# Patient Record
Sex: Male | Born: 1945 | Race: White | Hispanic: No | Marital: Married
Health system: Southern US, Community
[De-identification: ages and names within clinical notes are randomized; demographics above are authoritative.]

---

## 2006-12-26 ENCOUNTER — Ambulatory Visit: Payer: Self-pay | Admitting: Cardiology

## 2012-10-02 ENCOUNTER — Emergency Department: Payer: Self-pay | Admitting: Emergency Medicine

## 2012-10-03 ENCOUNTER — Ambulatory Visit: Payer: Self-pay | Admitting: Internal Medicine

## 2013-10-05 IMAGING — US US EXTREM LOW VENOUS*L*
1 series · 14 of 23 positions shown · non-contrast
Comparison: none

REASON FOR EXAM: Call Report [id number]  eval for DVT lower leg Arthralgia
edema
COMMENTS:

[Series 1: us extrem low venous*left* · 0.09mm/px · 14 of 23 slices shown]
[im 1/23]
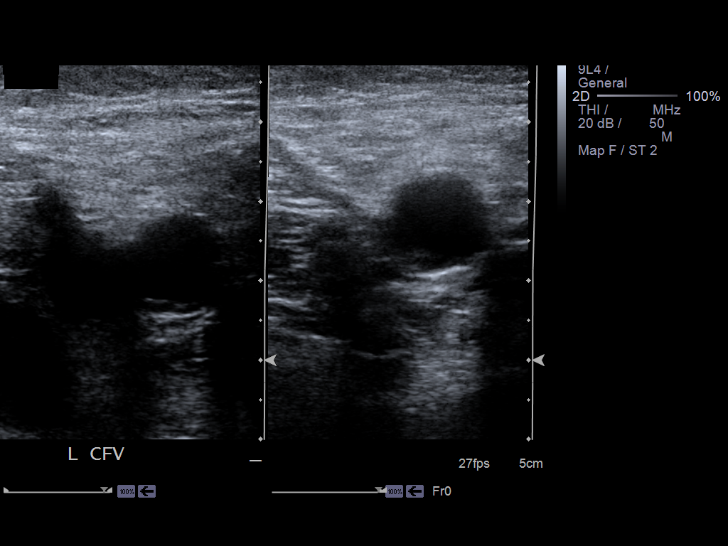
[im 3/23]
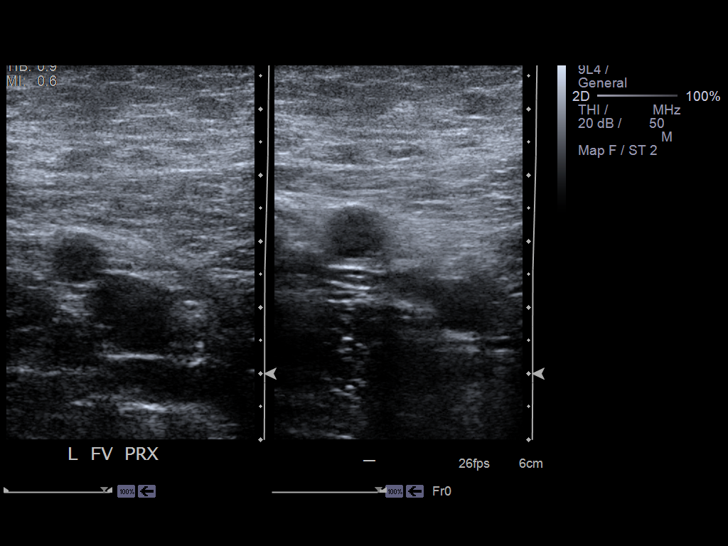
[im 5/23]
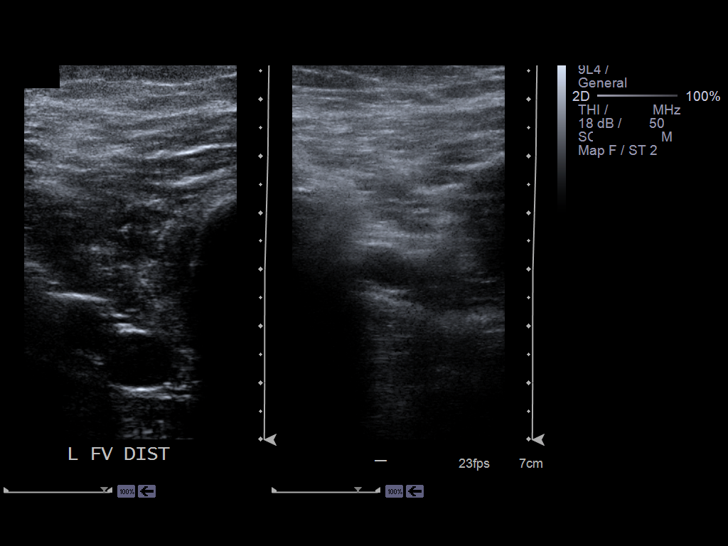
[im 6/23]
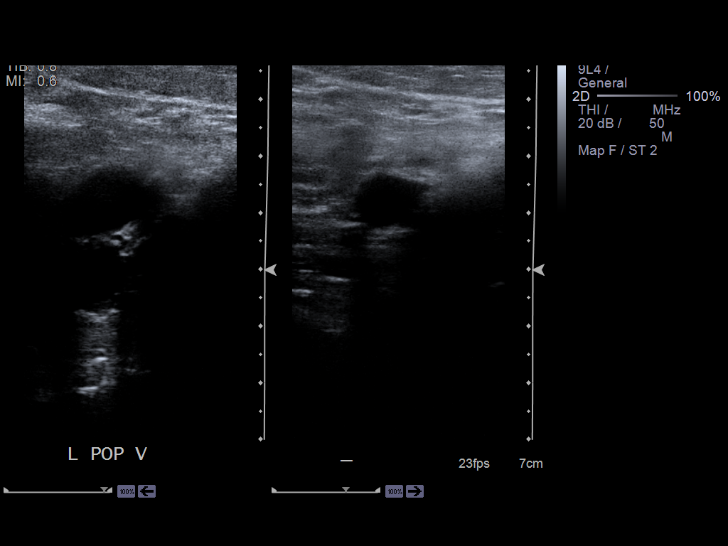
[im 8/23]
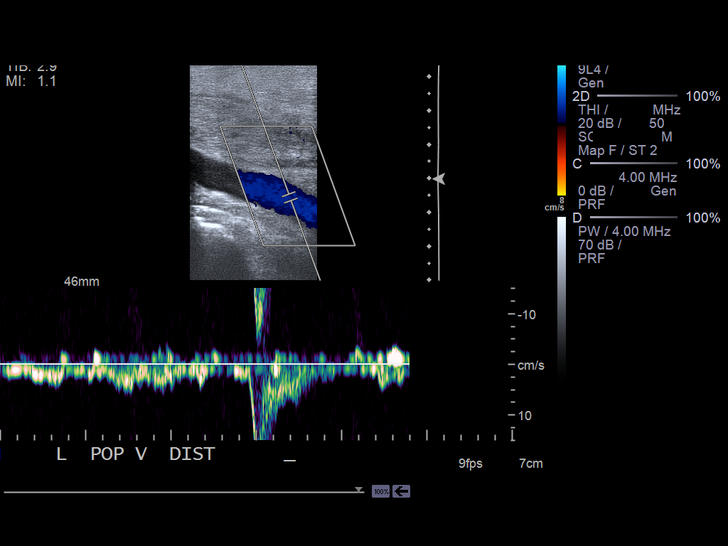
[im 10/23]
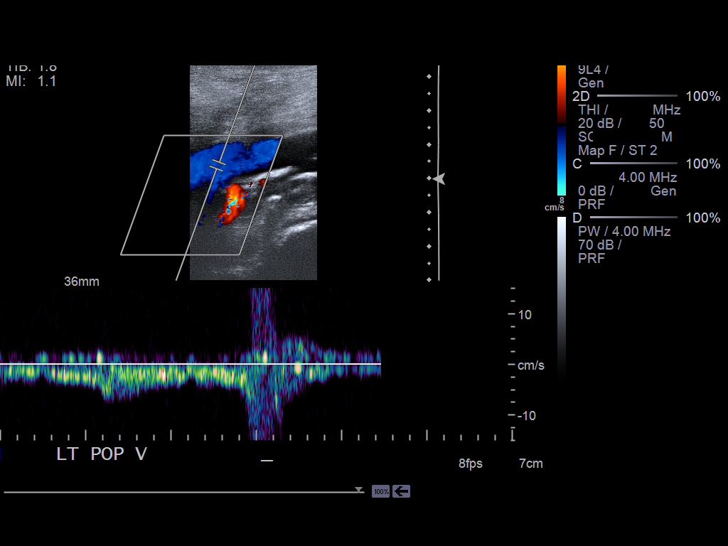
[im 11/23]
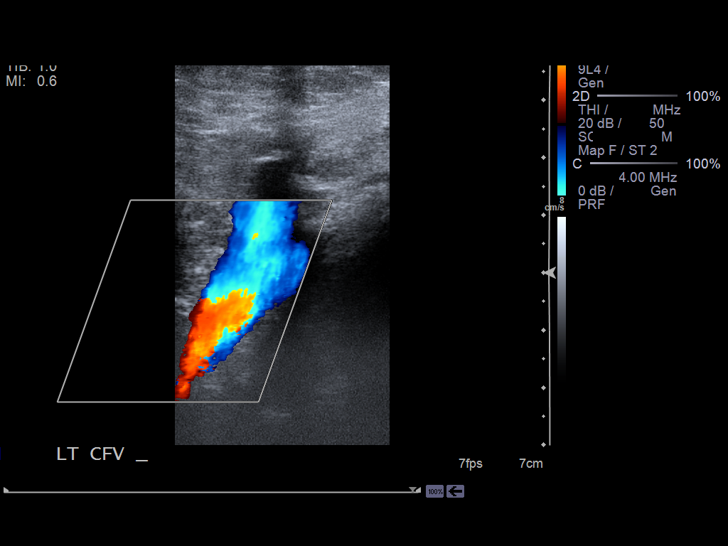
[im 13/23]
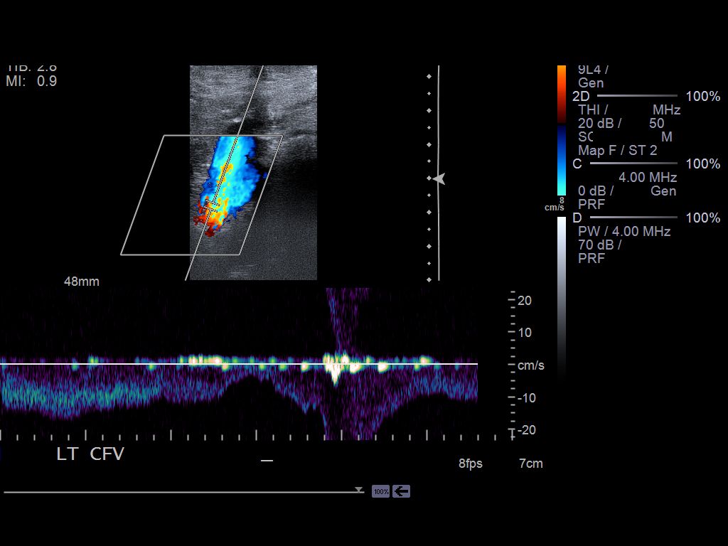
[im 14/23]
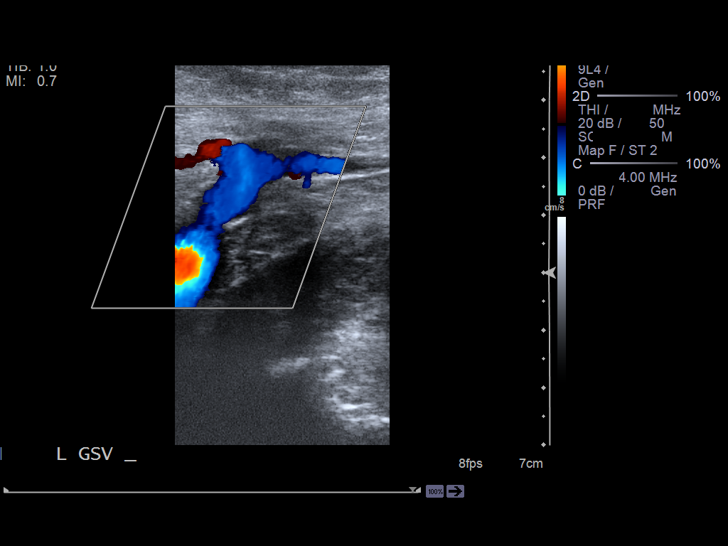
[im 16/23]
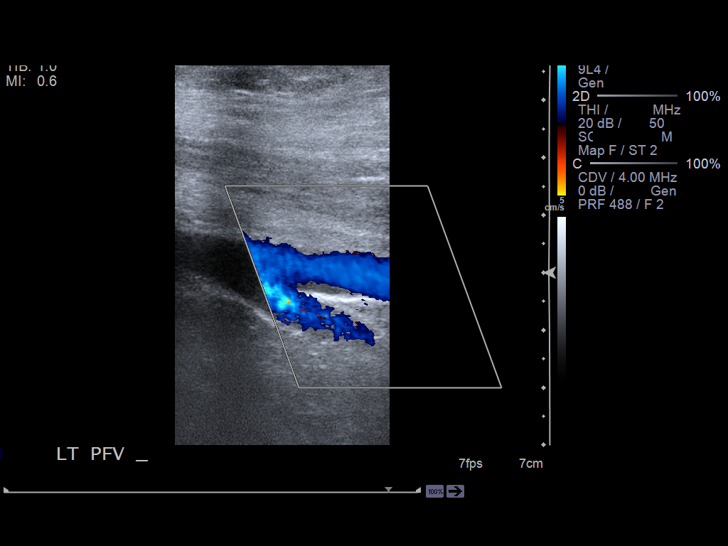
[im 18/23]
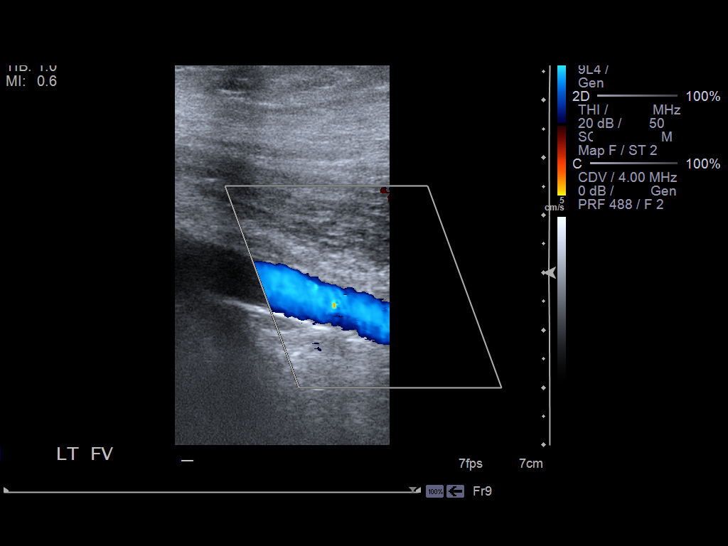
[im 19/23]
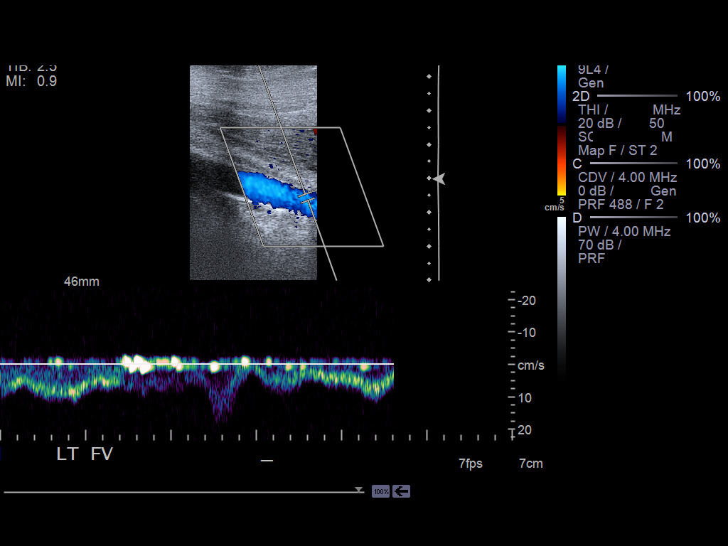
[im 21/23]
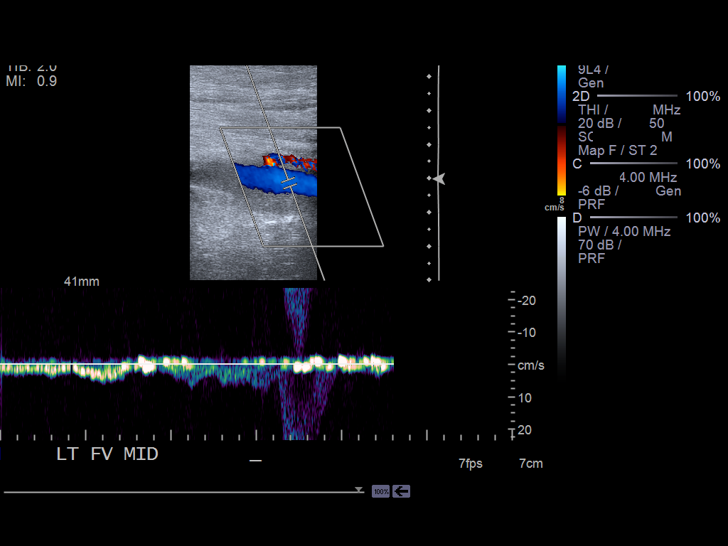
[im 23/23]
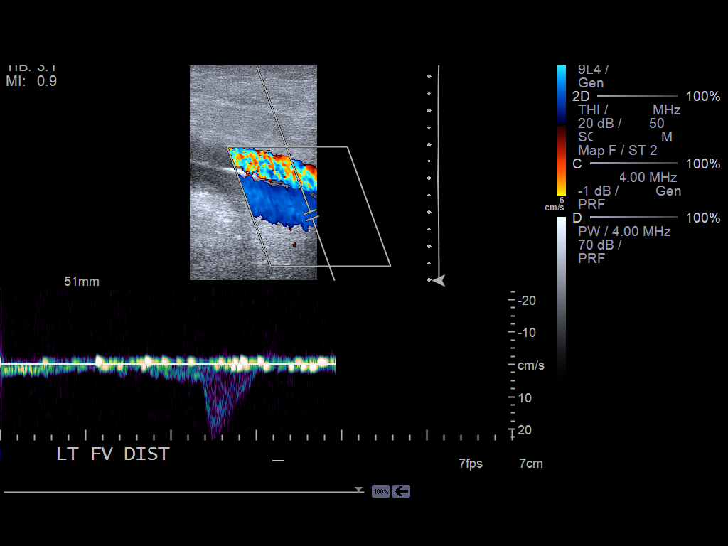

[14 of 23 positions shown; findings below may reference images not displayed]

PROCEDURE:     US  - US DOPPLER LOW EXTR LEFT  - October 03, 2012 [DATE]

RESULT:     Doppler interrogation of the deep venous system of the left leg
is performed from the common femoral vein through the popliteal vein. The
grayscale compression images show full compressibility. Color and spectral
Doppler appearance is normal.
IMPRESSION: 1. No evidence of left lower extremity deep vein thrombosis.

[REDACTED]

## 2019-09-09 DIAGNOSIS — Z23 Encounter for immunization: Secondary | ICD-10-CM | POA: Diagnosis not present

## 2019-12-12 ENCOUNTER — Ambulatory Visit: Payer: Self-pay | Attending: Internal Medicine

## 2019-12-12 DIAGNOSIS — Z23 Encounter for immunization: Secondary | ICD-10-CM | POA: Insufficient documentation

## 2019-12-12 NOTE — Progress Notes (Signed)
   Covid-19 Vaccination Clinic  Name:  BOSTON BLAIZE    MRN: [id number] DOB: 08-13-1946  12/12/2019  Mr. Sapon was observed post Covid-19 immunization for 15 minutes without incidence. He was provided with Vaccine Information Sheet and instruction to access the V-Safe system.   Mr. Sabatelli was instructed to call 911 with any severe reactions post vaccine: Marland Kitchen Difficulty breathing  . Swelling of your face and throat  . A fast heartbeat  . A bad rash all over your body  . Dizziness and weakness    Immunizations Administered    Name Date Dose VIS Date Route   Pfizer COVID-19 Vaccine 12/12/2019  5:41 PM 0.3 mL 11/01/2019 Intramuscular   Manufacturer: [place]   Lot: [id number]   [place]: [id number]

## 2020-01-02 ENCOUNTER — Ambulatory Visit: Payer: Self-pay | Attending: Internal Medicine

## 2020-01-02 DIAGNOSIS — Z23 Encounter for immunization: Secondary | ICD-10-CM | POA: Insufficient documentation

## 2020-01-02 NOTE — Progress Notes (Signed)
   Covid-19 Vaccination Clinic  Name:  Andre Davila    MRN: [id number] DOB: 22-May-1946  01/02/2020  [name] was observed post Covid-19 immunization for 15 minutes without incidence. He was provided with Vaccine Information Sheet and instruction to access the V-Safe system.   [name] was instructed to call 911 with any severe reactions post vaccine: Marland Kitchen Difficulty breathing  . Swelling of your face and throat  . A fast heartbeat  . A bad rash all over your body  . Dizziness and weakness    Immunizations Administered    Name Date Dose VIS Date Route   Pfizer COVID-19 Vaccine 01/02/2020  8:25 AM 0.3 mL 11/01/2019 Intramuscular   Manufacturer: [place]   Lot: [id number]   [place]: [id number]

## 2020-01-17 ENCOUNTER — Ambulatory Visit: Payer: Self-pay

## 2020-03-17 DIAGNOSIS — I10 Essential (primary) hypertension: Secondary | ICD-10-CM | POA: Diagnosis not present

## 2020-03-17 DIAGNOSIS — D539 Nutritional anemia, unspecified: Secondary | ICD-10-CM | POA: Diagnosis not present

## 2020-03-17 DIAGNOSIS — E7800 Pure hypercholesterolemia, unspecified: Secondary | ICD-10-CM | POA: Diagnosis not present

## 2020-03-24 DIAGNOSIS — E7800 Pure hypercholesterolemia, unspecified: Secondary | ICD-10-CM | POA: Diagnosis not present

## 2020-03-24 DIAGNOSIS — I10 Essential (primary) hypertension: Secondary | ICD-10-CM | POA: Diagnosis not present

## 2020-03-24 DIAGNOSIS — Z6836 Body mass index (BMI) 36.0-36.9, adult: Secondary | ICD-10-CM | POA: Diagnosis not present

## 2020-03-24 DIAGNOSIS — Z1331 Encounter for screening for depression: Secondary | ICD-10-CM | POA: Diagnosis not present

## 2020-03-24 DIAGNOSIS — E669 Obesity, unspecified: Secondary | ICD-10-CM | POA: Diagnosis not present

## 2020-03-24 DIAGNOSIS — D539 Nutritional anemia, unspecified: Secondary | ICD-10-CM | POA: Diagnosis not present

## 2020-03-24 DIAGNOSIS — Z87891 Personal history of nicotine dependence: Secondary | ICD-10-CM | POA: Diagnosis not present

## 2020-11-27 DIAGNOSIS — E7800 Pure hypercholesterolemia, unspecified: Secondary | ICD-10-CM | POA: Diagnosis not present

## 2020-11-27 DIAGNOSIS — D539 Nutritional anemia, unspecified: Secondary | ICD-10-CM | POA: Diagnosis not present

## 2020-12-04 DIAGNOSIS — Z0000 Encounter for general adult medical examination without abnormal findings: Secondary | ICD-10-CM | POA: Diagnosis not present

## 2021-04-13 DIAGNOSIS — K5730 Diverticulosis of large intestine without perforation or abscess without bleeding: Secondary | ICD-10-CM | POA: Diagnosis not present

## 2021-04-13 DIAGNOSIS — Z800 Family history of malignant neoplasm of digestive organs: Secondary | ICD-10-CM | POA: Diagnosis not present

## 2021-04-13 DIAGNOSIS — K621 Rectal polyp: Secondary | ICD-10-CM | POA: Diagnosis not present

## 2021-04-13 DIAGNOSIS — K635 Polyp of colon: Secondary | ICD-10-CM | POA: Diagnosis not present

## 2021-04-13 DIAGNOSIS — D122 Benign neoplasm of ascending colon: Secondary | ICD-10-CM | POA: Diagnosis not present

## 2021-04-13 DIAGNOSIS — Z1211 Encounter for screening for malignant neoplasm of colon: Secondary | ICD-10-CM | POA: Diagnosis not present

## 2021-04-13 DIAGNOSIS — Z86010 Personal history of colonic polyps: Secondary | ICD-10-CM | POA: Diagnosis not present

## 2021-04-13 DIAGNOSIS — K648 Other hemorrhoids: Secondary | ICD-10-CM | POA: Diagnosis not present

## 2021-04-13 DIAGNOSIS — D127 Benign neoplasm of rectosigmoid junction: Secondary | ICD-10-CM | POA: Diagnosis not present

## 2021-04-26 DIAGNOSIS — L728 Other follicular cysts of the skin and subcutaneous tissue: Secondary | ICD-10-CM | POA: Diagnosis not present

## 2021-05-17 DIAGNOSIS — Z0100 Encounter for examination of eyes and vision without abnormal findings: Secondary | ICD-10-CM | POA: Diagnosis not present

## 2021-05-17 DIAGNOSIS — H2513 Age-related nuclear cataract, bilateral: Secondary | ICD-10-CM | POA: Diagnosis not present

## 2021-05-17 DIAGNOSIS — H43811 Vitreous degeneration, right eye: Secondary | ICD-10-CM | POA: Diagnosis not present

## 2021-05-18 DIAGNOSIS — L720 Epidermal cyst: Secondary | ICD-10-CM | POA: Diagnosis not present

## 2021-05-18 DIAGNOSIS — L538 Other specified erythematous conditions: Secondary | ICD-10-CM | POA: Diagnosis not present

## 2021-05-18 DIAGNOSIS — L728 Other follicular cysts of the skin and subcutaneous tissue: Secondary | ICD-10-CM | POA: Diagnosis not present

## 2021-05-27 DIAGNOSIS — I10 Essential (primary) hypertension: Secondary | ICD-10-CM | POA: Diagnosis not present

## 2021-05-27 DIAGNOSIS — E7800 Pure hypercholesterolemia, unspecified: Secondary | ICD-10-CM | POA: Diagnosis not present

## 2021-05-27 DIAGNOSIS — D539 Nutritional anemia, unspecified: Secondary | ICD-10-CM | POA: Diagnosis not present

## 2021-05-31 DIAGNOSIS — R208 Other disturbances of skin sensation: Secondary | ICD-10-CM | POA: Diagnosis not present

## 2021-05-31 DIAGNOSIS — L538 Other specified erythematous conditions: Secondary | ICD-10-CM | POA: Diagnosis not present

## 2021-05-31 DIAGNOSIS — L720 Epidermal cyst: Secondary | ICD-10-CM | POA: Diagnosis not present

## 2021-05-31 DIAGNOSIS — L728 Other follicular cysts of the skin and subcutaneous tissue: Secondary | ICD-10-CM | POA: Diagnosis not present

## 2021-06-03 DIAGNOSIS — D539 Nutritional anemia, unspecified: Secondary | ICD-10-CM | POA: Diagnosis not present

## 2021-06-03 DIAGNOSIS — E7800 Pure hypercholesterolemia, unspecified: Secondary | ICD-10-CM | POA: Diagnosis not present

## 2021-06-03 DIAGNOSIS — E669 Obesity, unspecified: Secondary | ICD-10-CM | POA: Diagnosis not present

## 2021-06-03 DIAGNOSIS — I10 Essential (primary) hypertension: Secondary | ICD-10-CM | POA: Diagnosis not present

## 2021-09-23 DIAGNOSIS — M25562 Pain in left knee: Secondary | ICD-10-CM | POA: Diagnosis not present

## 2021-12-02 DIAGNOSIS — D539 Nutritional anemia, unspecified: Secondary | ICD-10-CM | POA: Diagnosis not present

## 2021-12-02 DIAGNOSIS — E7800 Pure hypercholesterolemia, unspecified: Secondary | ICD-10-CM | POA: Diagnosis not present

## 2021-12-09 DIAGNOSIS — Z1389 Encounter for screening for other disorder: Secondary | ICD-10-CM | POA: Diagnosis not present

## 2021-12-09 DIAGNOSIS — E7800 Pure hypercholesterolemia, unspecified: Secondary | ICD-10-CM | POA: Diagnosis not present

## 2021-12-09 DIAGNOSIS — E669 Obesity, unspecified: Secondary | ICD-10-CM | POA: Diagnosis not present

## 2021-12-09 DIAGNOSIS — I10 Essential (primary) hypertension: Secondary | ICD-10-CM | POA: Diagnosis not present

## 2021-12-09 DIAGNOSIS — Z0000 Encounter for general adult medical examination without abnormal findings: Secondary | ICD-10-CM | POA: Diagnosis not present

## 2022-01-28 DIAGNOSIS — R6889 Other general symptoms and signs: Secondary | ICD-10-CM | POA: Diagnosis not present

## 2022-02-28 DIAGNOSIS — Z03818 Encounter for observation for suspected exposure to other biological agents ruled out: Secondary | ICD-10-CM | POA: Diagnosis not present

## 2022-02-28 DIAGNOSIS — Z20822 Contact with and (suspected) exposure to covid-19: Secondary | ICD-10-CM | POA: Diagnosis not present

## 2022-02-28 DIAGNOSIS — J00 Acute nasopharyngitis [common cold]: Secondary | ICD-10-CM | POA: Diagnosis not present

## 2022-06-10 DIAGNOSIS — E7800 Pure hypercholesterolemia, unspecified: Secondary | ICD-10-CM | POA: Diagnosis not present

## 2022-06-16 DIAGNOSIS — E7800 Pure hypercholesterolemia, unspecified: Secondary | ICD-10-CM | POA: Diagnosis not present

## 2022-06-16 DIAGNOSIS — I10 Essential (primary) hypertension: Secondary | ICD-10-CM | POA: Diagnosis not present

## 2022-06-16 DIAGNOSIS — E669 Obesity, unspecified: Secondary | ICD-10-CM | POA: Diagnosis not present

## 2023-05-29 DIAGNOSIS — I10 Essential (primary) hypertension: Secondary | ICD-10-CM | POA: Diagnosis not present

## 2023-05-29 DIAGNOSIS — E7800 Pure hypercholesterolemia, unspecified: Secondary | ICD-10-CM | POA: Diagnosis not present

## 2023-06-05 DIAGNOSIS — Z1331 Encounter for screening for depression: Secondary | ICD-10-CM | POA: Diagnosis not present

## 2023-06-05 DIAGNOSIS — Z6836 Body mass index (BMI) 36.0-36.9, adult: Secondary | ICD-10-CM | POA: Diagnosis not present

## 2023-06-05 DIAGNOSIS — E669 Obesity, unspecified: Secondary | ICD-10-CM | POA: Diagnosis not present

## 2023-06-05 DIAGNOSIS — Z0000 Encounter for general adult medical examination without abnormal findings: Secondary | ICD-10-CM | POA: Diagnosis not present

## 2023-06-05 DIAGNOSIS — I10 Essential (primary) hypertension: Secondary | ICD-10-CM | POA: Diagnosis not present

## 2023-11-30 DIAGNOSIS — I10 Essential (primary) hypertension: Secondary | ICD-10-CM | POA: Diagnosis not present

## 2024-05-30 DIAGNOSIS — I10 Essential (primary) hypertension: Secondary | ICD-10-CM | POA: Diagnosis not present

## 2024-05-30 DIAGNOSIS — E669 Obesity, unspecified: Secondary | ICD-10-CM | POA: Diagnosis not present

## 2024-05-30 DIAGNOSIS — Z6836 Body mass index (BMI) 36.0-36.9, adult: Secondary | ICD-10-CM | POA: Diagnosis not present

## 2024-06-01 DIAGNOSIS — H52223 Regular astigmatism, bilateral: Secondary | ICD-10-CM | POA: Diagnosis not present

## 2024-06-13 DIAGNOSIS — E669 Obesity, unspecified: Secondary | ICD-10-CM | POA: Diagnosis not present

## 2024-06-13 DIAGNOSIS — Z0000 Encounter for general adult medical examination without abnormal findings: Secondary | ICD-10-CM | POA: Diagnosis not present

## 2024-06-13 DIAGNOSIS — Z6836 Body mass index (BMI) 36.0-36.9, adult: Secondary | ICD-10-CM | POA: Diagnosis not present

## 2024-06-13 DIAGNOSIS — I10 Essential (primary) hypertension: Secondary | ICD-10-CM | POA: Diagnosis not present

## 2024-06-13 DIAGNOSIS — Z1331 Encounter for screening for depression: Secondary | ICD-10-CM | POA: Diagnosis not present
